# Patient Record
Sex: Female | Born: 1955 | Race: Black or African American | Hispanic: No | Smoking: Never smoker
Health system: Southern US, Community
[De-identification: ages and names within clinical notes are randomized; demographics above are authoritative.]

## PROBLEM LIST (undated history)

## (undated) DIAGNOSIS — C50919 Malignant neoplasm of unspecified site of unspecified female breast: Secondary | ICD-10-CM

## (undated) HISTORY — DX: Malignant neoplasm of unspecified site of unspecified female breast: C50.919

---
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
## 2004-08-14 ENCOUNTER — Ambulatory Visit (HOSPITAL_COMMUNITY): Admission: RE | Admit: 2004-08-14 | Discharge: 2004-08-14 | Payer: Self-pay | Admitting: Obstetrics

## 2004-09-11 ENCOUNTER — Encounter: Admission: RE | Admit: 2004-09-11 | Discharge: 2004-09-11 | Payer: Self-pay | Admitting: Family Medicine

## 2006-07-26 ENCOUNTER — Other Ambulatory Visit: Admission: RE | Admit: 2006-07-26 | Discharge: 2006-07-26 | Payer: Self-pay | Admitting: Family Medicine

## 2006-08-27 ENCOUNTER — Encounter: Admission: RE | Admit: 2006-08-27 | Discharge: 2006-08-27 | Payer: Self-pay | Admitting: Family Medicine

## 2006-09-02 DIAGNOSIS — C50919 Malignant neoplasm of unspecified site of unspecified female breast: Secondary | ICD-10-CM

## 2006-09-02 HISTORY — DX: Malignant neoplasm of unspecified site of unspecified female breast: C50.919

## 2006-09-06 ENCOUNTER — Encounter: Admission: RE | Admit: 2006-09-06 | Discharge: 2006-09-06 | Payer: Self-pay | Admitting: Family Medicine

## 2006-09-23 ENCOUNTER — Encounter: Admission: RE | Admit: 2006-09-23 | Discharge: 2006-09-23 | Payer: Self-pay | Admitting: Family Medicine

## 2006-09-23 ENCOUNTER — Encounter (INDEPENDENT_AMBULATORY_CARE_PROVIDER_SITE_OTHER): Payer: Self-pay | Admitting: Diagnostic Radiology

## 2006-10-04 ENCOUNTER — Ambulatory Visit: Admission: RE | Admit: 2006-10-04 | Discharge: 2006-12-06 | Payer: Self-pay | Admitting: Radiation Oncology

## 2006-10-14 ENCOUNTER — Encounter: Admission: RE | Admit: 2006-10-14 | Discharge: 2006-10-14 | Payer: Self-pay | Admitting: Gynecology

## 2006-12-03 HISTORY — PX: BREAST LUMPECTOMY: SHX2

## 2006-12-21 ENCOUNTER — Ambulatory Visit (HOSPITAL_BASED_OUTPATIENT_CLINIC_OR_DEPARTMENT_OTHER): Admission: RE | Admit: 2006-12-21 | Discharge: 2006-12-21 | Payer: Self-pay | Admitting: General Surgery

## 2006-12-21 ENCOUNTER — Encounter: Admission: RE | Admit: 2006-12-21 | Discharge: 2006-12-21 | Payer: Self-pay | Admitting: General Surgery

## 2006-12-21 ENCOUNTER — Encounter (INDEPENDENT_AMBULATORY_CARE_PROVIDER_SITE_OTHER): Payer: Self-pay | Admitting: General Surgery

## 2006-12-31 ENCOUNTER — Ambulatory Visit: Admission: RE | Admit: 2006-12-31 | Discharge: 2007-03-21 | Payer: Self-pay | Admitting: Radiation Oncology

## 2007-01-10 ENCOUNTER — Ambulatory Visit: Payer: Self-pay | Admitting: Oncology

## 2007-01-10 LAB — CBC WITH DIFFERENTIAL/PLATELET
BASO%: 0.7 % (ref 0.0–2.0)
Basophils Absolute: 0 10*3/uL (ref 0.0–0.1)
Eosinophils Absolute: 0.2 10*3/uL (ref 0.0–0.5)
HCT: 33.8 % — ABNORMAL LOW (ref 34.8–46.6)
HGB: 12 g/dL (ref 11.6–15.9)
MONO#: 0.5 10*3/uL (ref 0.1–0.9)
NEUT#: 2.8 10*3/uL (ref 1.5–6.5)
NEUT%: 51.6 % (ref 39.6–76.8)
WBC: 5.5 10*3/uL (ref 3.9–10.0)
lymph#: 1.9 10*3/uL (ref 0.9–3.3)

## 2007-01-10 LAB — COMPREHENSIVE METABOLIC PANEL
AST: 13 U/L (ref 0–37)
Albumin: 4.1 g/dL (ref 3.5–5.2)
BUN: 11 mg/dL (ref 6–23)
CO2: 24 mEq/L (ref 19–32)
Calcium: 9.1 mg/dL (ref 8.4–10.5)
Chloride: 106 mEq/L (ref 96–112)
Creatinine, Ser: 0.73 mg/dL (ref 0.40–1.20)
Glucose, Bld: 113 mg/dL — ABNORMAL HIGH (ref 70–99)
Potassium: 3.9 mEq/L (ref 3.5–5.3)

## 2007-03-09 ENCOUNTER — Ambulatory Visit: Payer: Self-pay | Admitting: Oncology

## 2007-06-08 ENCOUNTER — Ambulatory Visit: Payer: Self-pay | Admitting: Oncology

## 2007-06-22 LAB — COMPREHENSIVE METABOLIC PANEL
Alkaline Phosphatase: 50 U/L (ref 39–117)
CO2: 25 mEq/L (ref 19–32)
Creatinine, Ser: 0.72 mg/dL (ref 0.40–1.20)
Glucose, Bld: 79 mg/dL (ref 70–99)
Total Bilirubin: 0.5 mg/dL (ref 0.3–1.2)

## 2007-06-22 LAB — CBC WITH DIFFERENTIAL/PLATELET
Eosinophils Absolute: 0.2 10*3/uL (ref 0.0–0.5)
HCT: 35.4 % (ref 34.8–46.6)
LYMPH%: 26.4 % (ref 14.0–48.0)
MCHC: 35.6 g/dL (ref 32.0–36.0)
MCV: 89 fL (ref 81.0–101.0)
MONO#: 0.8 10*3/uL (ref 0.1–0.9)
MONO%: 13.4 % — ABNORMAL HIGH (ref 0.0–13.0)
NEUT#: 3.3 10*3/uL (ref 1.5–6.5)
NEUT%: 56.6 % (ref 39.6–76.8)
Platelets: 288 10*3/uL (ref 145–400)
WBC: 5.9 10*3/uL (ref 3.9–10.0)

## 2007-06-22 LAB — LACTATE DEHYDROGENASE: LDH: 166 U/L (ref 94–250)

## 2007-09-09 ENCOUNTER — Encounter: Admission: RE | Admit: 2007-09-09 | Discharge: 2007-09-09 | Payer: Self-pay | Admitting: Oncology

## 2007-09-20 ENCOUNTER — Ambulatory Visit: Payer: Self-pay | Admitting: Oncology

## 2008-09-18 ENCOUNTER — Ambulatory Visit: Payer: Self-pay | Admitting: Oncology

## 2008-09-20 LAB — COMPREHENSIVE METABOLIC PANEL
AST: 18 U/L (ref 0–37)
Alkaline Phosphatase: 50 U/L (ref 39–117)
BUN: 9 mg/dL (ref 6–23)
CO2: 29 mEq/L (ref 19–32)
Chloride: 108 mEq/L (ref 96–112)
Glucose, Bld: 95 mg/dL (ref 70–99)
Sodium: 141 mEq/L (ref 135–145)
Total Protein: 6.9 g/dL (ref 6.0–8.3)

## 2008-09-20 LAB — CBC WITH DIFFERENTIAL/PLATELET
Eosinophils Absolute: 0.1 10*3/uL (ref 0.0–0.5)
MCH: 30 pg (ref 25.1–34.0)
MCV: 82.8 fL (ref 79.5–101.0)
MONO#: 0.6 10*3/uL (ref 0.1–0.9)
MONO%: 8.7 % (ref 0.0–14.0)
NEUT%: 53 % (ref 38.4–76.8)
Platelets: 220 10*3/uL (ref 145–400)
WBC: 6.8 10*3/uL (ref 3.9–10.3)
lymph#: 2.4 10*3/uL (ref 0.9–3.3)

## 2008-10-23 ENCOUNTER — Encounter: Admission: RE | Admit: 2008-10-23 | Discharge: 2008-10-23 | Payer: Self-pay | Admitting: Oncology

## 2009-10-22 ENCOUNTER — Ambulatory Visit: Payer: Self-pay | Admitting: Oncology

## 2009-10-24 ENCOUNTER — Encounter: Admission: RE | Admit: 2009-10-24 | Discharge: 2009-10-24 | Payer: Self-pay | Admitting: Oncology

## 2009-10-24 LAB — CBC WITH DIFFERENTIAL/PLATELET
Eosinophils Absolute: 0.1 10*3/uL (ref 0.0–0.5)
HGB: 12.3 g/dL (ref 11.6–15.9)
LYMPH%: 26.9 % (ref 14.0–49.7)
MCH: 31 pg (ref 25.1–34.0)
MCHC: 34.4 g/dL (ref 31.5–36.0)
MCV: 90.2 fL (ref 79.5–101.0)
MONO#: 0.6 10*3/uL (ref 0.1–0.9)
MONO%: 9.6 % (ref 0.0–14.0)
RBC: 3.96 10*6/uL (ref 3.70–5.45)
RDW: 13.3 % (ref 11.2–14.5)
lymph#: 1.7 10*3/uL (ref 0.9–3.3)

## 2009-10-25 LAB — COMPREHENSIVE METABOLIC PANEL
ALT: 10 U/L (ref 0–35)
AST: 11 U/L (ref 0–37)
Albumin: 3.8 g/dL (ref 3.5–5.2)
CO2: 28 mEq/L (ref 19–32)
Calcium: 9 mg/dL (ref 8.4–10.5)
Potassium: 4 mEq/L (ref 3.5–5.3)
Total Bilirubin: 0.5 mg/dL (ref 0.3–1.2)
Total Protein: 7 g/dL (ref 6.0–8.3)

## 2009-10-25 LAB — CANCER ANTIGEN 27.29: CA 27.29: 14 U/mL (ref 0–39)

## 2009-10-29 ENCOUNTER — Encounter: Admission: RE | Admit: 2009-10-29 | Discharge: 2009-10-29 | Payer: Self-pay | Admitting: Oncology

## 2009-11-01 ENCOUNTER — Ambulatory Visit: Payer: Self-pay | Admitting: Genetic Counselor

## 2010-05-25 ENCOUNTER — Encounter: Payer: Self-pay | Admitting: Family Medicine

## 2010-07-04 ENCOUNTER — Encounter: Payer: Managed Care, Other (non HMO) | Admitting: Oncology

## 2010-07-07 ENCOUNTER — Other Ambulatory Visit (HOSPITAL_COMMUNITY)
Admission: RE | Admit: 2010-07-07 | Discharge: 2010-07-07 | Disposition: A | Discharge status: Home / Self Care | Payer: Managed Care, Other (non HMO) | Source: Ambulatory Visit | Attending: Family Medicine | Admitting: Family Medicine

## 2010-07-07 ENCOUNTER — Other Ambulatory Visit: Payer: Self-pay | Admitting: Family Medicine

## 2010-07-07 DIAGNOSIS — Z0000 Encounter for general adult medical examination without abnormal findings: Secondary | ICD-10-CM | POA: Insufficient documentation

## 2010-09-16 NOTE — Op Note (Signed)
Janet Evans, Janet Evans       ACCOUNT NO.:  [id number]   MEDICAL RECORD NO.:  [id number]          PATIENT TYPE:  AMB   LOCATION:  DSC                          FACILITY:  [facility]   PHYSICIAN:  Angelia Mould. Derrell Lolling, M.D.DATE OF BIRTH:  11-15-1955   DATE OF PROCEDURE:  12/21/2006  DATE OF DISCHARGE:                               OPERATIVE REPORT   PREOPERATIVE DIAGNOSIS:  Ductal carcinoma in situ left breast.   POSTOPERATIVE DIAGNOSIS:  Ductal carcinoma in situ left breast.   OPERATION PERFORMED:  Left partial mastectomy with needle localization  and specimen mammogram.   SURGEON:  Angelia Mould. Derrell Lolling, M.D.   OPERATIVE INDICATIONS:  This is a 55 year old black female who underwent  mammograms, ultrasound and MRI recently.  She was found to have a focal  area of clustered microcalcifications centrally within the left breast.  Stereotactic core biopsy was performed and revealed ductal carcinoma in  situ.  The tumor is hormone receptor positive.  The MRI showed that this  was a solitary finding.  She was interested in breast conservation.  We  counseled her as an outpatient.  She was brought to the operating room  electively.   OPERATIVE TECHNIQUE:  The patient underwent needle localization by Dr.  Cain Saupe this morning, and that was very satisfactory.  She was  brought to operating room.  General anesthesia was induced.  The left  breast was prepped and draped in a sterile fashion.  The patient was  identified as the correct patient, correct procedure and correct site.  Intravenous antibiotics were given prior to the incision.  The  radiographs were reviewed and oriented.   The localizing wire entered the left breast superiorly and was directed  inferiorly and posteriorly.  A curved incision was made superiorly in  the right breast, about halfway between the insertion site of the wire  and the areolar margin.  Dissection was carried down into the breast  tissue and around the  localizing wire.  I took the dissection more  medially at Dr. Teodoro Kil suggestion, because the calcifications were  central and the previously placed clip was a little bit medial.  We got  all the way around the tissue.  We marked the specimen with silk sutures  to mark the superficial and the lateral margins.  Specimen mammogram  revealed that I had completely removed the microcalcifications and the  clip.  The specimen was sent for routine histology.   I discussed this with Dr. Jimmy Picket in the pathology lab.  He did not  feel that imprint cytology would be useful in ductal carcinoma in situ.   Hemostasis was excellent and achieved with electrocautery.  The deeper  breast tissues were closed with interrupted sutures of 3-0 Vicryl.  The  skin was closed with a running subcuticular suture of  4-0 Monocryl and  Steri-Strips.  Clean bandages were placed and the patient taken to  recovery room in stable condition.  Estimated blood loss was about 10-15  mL.   COMPLICATIONS:  None.   COUNTS:  Sponge and needle and instrument counts were correct.      Angelia Mould. Derrell Lolling, M.D.  Electronically Signed     HMI/MEDQ  D:  12/21/2006  T:  12/21/2006  Job:  [id number]   cc:   Norva Pavlov, M.D.  Deatra James, M.D.  Billie Lade, M.D.

## 2010-09-18 ENCOUNTER — Other Ambulatory Visit: Payer: Self-pay | Admitting: Gastroenterology

## 2010-11-21 ENCOUNTER — Other Ambulatory Visit (HOSPITAL_COMMUNITY): Payer: Self-pay | Admitting: Family Medicine

## 2010-11-21 ENCOUNTER — Other Ambulatory Visit: Payer: Self-pay | Admitting: Oncology

## 2010-11-21 DIAGNOSIS — Z9889 Other specified postprocedural states: Secondary | ICD-10-CM

## 2010-11-21 DIAGNOSIS — Z853 Personal history of malignant neoplasm of breast: Secondary | ICD-10-CM

## 2010-11-27 ENCOUNTER — Ambulatory Visit
Admission: RE | Admit: 2010-11-27 | Discharge: 2010-11-27 | Disposition: A | Discharge status: Home / Self Care | Payer: Managed Care, Other (non HMO) | Source: Ambulatory Visit | Attending: Oncology | Admitting: Oncology

## 2010-11-27 DIAGNOSIS — Z853 Personal history of malignant neoplasm of breast: Secondary | ICD-10-CM

## 2010-11-27 DIAGNOSIS — Z9889 Other specified postprocedural states: Secondary | ICD-10-CM

## 2011-02-10 ENCOUNTER — Other Ambulatory Visit: Payer: Self-pay | Admitting: Oncology

## 2011-02-10 ENCOUNTER — Encounter (HOSPITAL_BASED_OUTPATIENT_CLINIC_OR_DEPARTMENT_OTHER): Payer: Managed Care, Other (non HMO) | Admitting: Oncology

## 2011-02-10 DIAGNOSIS — D0590 Unspecified type of carcinoma in situ of unspecified breast: Secondary | ICD-10-CM

## 2011-02-10 DIAGNOSIS — Z79810 Long term (current) use of selective estrogen receptor modulators (SERMs): Secondary | ICD-10-CM

## 2011-02-10 DIAGNOSIS — Z170 Estrogen receptor positive status [ER+]: Secondary | ICD-10-CM

## 2011-02-10 LAB — CBC WITH DIFFERENTIAL/PLATELET
Basophils Absolute: 0 10*3/uL (ref 0.0–0.1)
EOS%: 5 % (ref 0.0–7.0)
HCT: 34.4 % — ABNORMAL LOW (ref 34.8–46.6)
HGB: 12.3 g/dL (ref 11.6–15.9)
MCH: 30 pg (ref 25.1–34.0)
MONO#: 0.8 10*3/uL (ref 0.1–0.9)
NEUT#: 3.3 10*3/uL (ref 1.5–6.5)
NEUT%: 54.3 % (ref 38.4–76.8)
RDW: 13.5 % (ref 11.2–14.5)
WBC: 6.2 10*3/uL (ref 3.9–10.3)
lymph#: 1.6 10*3/uL (ref 0.9–3.3)

## 2011-02-10 LAB — COMPREHENSIVE METABOLIC PANEL
AST: 14 U/L (ref 0–37)
Albumin: 3.7 g/dL (ref 3.5–5.2)
Alkaline Phosphatase: 59 U/L (ref 39–117)
BUN: 15 mg/dL (ref 6–23)
Calcium: 8.8 mg/dL (ref 8.4–10.5)
Chloride: 108 mEq/L (ref 96–112)
Glucose, Bld: 92 mg/dL (ref 70–99)
Potassium: 4 mEq/L (ref 3.5–5.3)
Sodium: 141 mEq/L (ref 135–145)
Total Protein: 6.8 g/dL (ref 6.0–8.3)

## 2011-02-13 LAB — CBC
HCT: 33.2 — ABNORMAL LOW
Hemoglobin: 11.4 — ABNORMAL LOW
MCV: 88.1
Platelets: 292
RDW: 17.1 — ABNORMAL HIGH

## 2011-02-13 LAB — COMPREHENSIVE METABOLIC PANEL
ALT: 12
AST: 12
Alkaline Phosphatase: 42
BUN: 12
Calcium: 8.6
Chloride: 108
GFR calc non Af Amer: >60
Glucose, Bld: 110 — ABNORMAL HIGH
Total Protein: 6.6

## 2011-02-13 LAB — URINALYSIS, ROUTINE W REFLEX MICROSCOPIC
Glucose, UA: NEGATIVE
Hgb urine dipstick: NEGATIVE
Ketones, ur: NEGATIVE
Protein, ur: NEGATIVE
Urobilinogen, UA: 1

## 2011-02-13 LAB — DIFFERENTIAL
Basophils Relative: 1
Eosinophils Absolute: 0.1
Eosinophils Relative: 3
Lymphs Abs: 1.5
Monocytes Relative: 11

## 2011-02-13 LAB — URINE MICROSCOPIC-ADD ON

## 2011-02-18 ENCOUNTER — Encounter (HOSPITAL_BASED_OUTPATIENT_CLINIC_OR_DEPARTMENT_OTHER): Payer: Managed Care, Other (non HMO) | Admitting: Oncology

## 2011-02-18 DIAGNOSIS — D0590 Unspecified type of carcinoma in situ of unspecified breast: Secondary | ICD-10-CM

## 2011-02-18 DIAGNOSIS — Z923 Personal history of irradiation: Secondary | ICD-10-CM

## 2011-02-18 DIAGNOSIS — Z79810 Long term (current) use of selective estrogen receptor modulators (SERMs): Secondary | ICD-10-CM

## 2011-02-18 DIAGNOSIS — Z170 Estrogen receptor positive status [ER+]: Secondary | ICD-10-CM

## 2011-12-18 ENCOUNTER — Telehealth: Payer: Self-pay | Admitting: Oncology

## 2011-12-18 NOTE — Telephone Encounter (Signed)
lmonvm for pt re appt for 10/18 and mailed schedule,

## 2012-02-11 ENCOUNTER — Other Ambulatory Visit: Payer: Self-pay | Admitting: *Deleted

## 2012-02-11 DIAGNOSIS — C50919 Malignant neoplasm of unspecified site of unspecified female breast: Secondary | ICD-10-CM

## 2012-02-12 ENCOUNTER — Other Ambulatory Visit: Payer: Managed Care, Other (non HMO) | Admitting: Lab

## 2012-02-12 ENCOUNTER — Ambulatory Visit: Payer: Managed Care, Other (non HMO) | Admitting: Oncology

## 2012-02-19 ENCOUNTER — Other Ambulatory Visit: Payer: Managed Care, Other (non HMO) | Admitting: Lab

## 2012-02-19 ENCOUNTER — Telehealth: Payer: Self-pay | Admitting: *Deleted

## 2012-02-19 ENCOUNTER — Ambulatory Visit: Payer: Managed Care, Other (non HMO) | Admitting: Oncology

## 2012-02-19 NOTE — Telephone Encounter (Signed)
Gave patient appointment for 03-08-2012 starting at 1:00pm

## 2012-03-08 ENCOUNTER — Ambulatory Visit (HOSPITAL_BASED_OUTPATIENT_CLINIC_OR_DEPARTMENT_OTHER): Payer: Managed Care, Other (non HMO) | Admitting: Oncology

## 2012-03-08 ENCOUNTER — Telehealth: Payer: Self-pay | Admitting: Oncology

## 2012-03-08 ENCOUNTER — Other Ambulatory Visit (HOSPITAL_BASED_OUTPATIENT_CLINIC_OR_DEPARTMENT_OTHER): Payer: Managed Care, Other (non HMO)

## 2012-03-08 VITALS — BP 161/85 | HR 87 | Temp 98.3°F | Resp 20 | Ht 63.0 in | Wt 140.0 lb

## 2012-03-08 DIAGNOSIS — D0590 Unspecified type of carcinoma in situ of unspecified breast: Secondary | ICD-10-CM

## 2012-03-08 DIAGNOSIS — D0510 Intraductal carcinoma in situ of unspecified breast: Secondary | ICD-10-CM

## 2012-03-08 DIAGNOSIS — C50919 Malignant neoplasm of unspecified site of unspecified female breast: Secondary | ICD-10-CM

## 2012-03-08 LAB — CBC WITH DIFFERENTIAL/PLATELET
BASO%: 1.1 % (ref 0.0–2.0)
EOS%: 1.4 % (ref 0.0–7.0)
LYMPH%: 31.3 % (ref 14.0–49.7)
MCHC: 35.1 g/dL (ref 31.5–36.0)
MCV: 88.1 fL (ref 79.5–101.0)
MONO%: 8 % (ref 0.0–14.0)
Platelets: 239 10*3/uL (ref 145–400)
RBC: 4.12 10*6/uL (ref 3.70–5.45)

## 2012-03-08 LAB — COMPREHENSIVE METABOLIC PANEL (CC13)
ALT: 11 U/L (ref 0–55)
AST: 12 U/L (ref 5–34)
Alkaline Phosphatase: 68 U/L (ref 40–150)
Sodium: 141 mEq/L (ref 136–145)
Total Bilirubin: 0.56 mg/dL (ref 0.20–1.20)
Total Protein: 6.9 g/dL (ref 6.4–8.3)

## 2012-03-08 NOTE — Telephone Encounter (Signed)
gve the pt her nov 2014 appt calendar along with the mammo appt for nov at [facility]

## 2012-03-08 NOTE — Progress Notes (Signed)
Hematology and Oncology Follow Up Visit  Janet Evans [id number] 1956/03/23 56 y.o. 03/08/2012 2:25 PM   DIAGNOSIS:   Encounter Diagnosis  Name Primary?  Marland Kitchen DCIS (ductal carcinoma in situ) Yes     PAST THERAPY:  Lumpectomy 12/21/2006, although radiation therapy currently completing 5 years of tamoxifen therapy Interim History:  Patient returns for followup. She is doing well. She still is on tamoxifen and of this year. She has not had a mammogram that we can find for over a year. She is otherwise doing well. She is amenorrheic. She does admit any bleeding she does have occasional hot flashes.  Medications: I have reviewed the patient's current medications.  Allergies: Allergies not on file  Past Medical History, Surgical history, Social history, and Family History were reviewed and updated.  Review of Systems: Constitutional:  Negative for fever, chills, night sweats, anorexia, weight loss, pain. Cardiovascular: no chest pain or dyspnea on exertion Respiratory: no cough, shortness of breath, or wheezing Neurological: no TIA or stroke symptoms Dermatological: negative ENT: negative Skin Gastrointestinal: negative Genito-Urinary: negative Hematological and Lymphatic: negative Breast: negative Musculoskeletal: negative Remaining ROS negative.  Physical Exam:  Blood pressure 161/85, pulse 87, temperature 98.3 F (36.8 C), resp. rate 20, height 5\' 3"  (1.6 m), weight 140 lb (63.504 kg).  ECOG:  0  HEENT:  Sclerae anicteric, conjunctivae pink.  Oropharynx clear.  No mucositis or candidiasis.  Nodes:  No cervical, supraclavicular, or axillary lymphadenopathy palpated.  Breast Exam:  Right breast is benign.  No masses, discharge, skin change, or nipple inversion.  Left breast is benign.  No masses, discharge, skin change, or nipple inversion..  Lungs:  Clear to auscultation bilaterally.  No crackles, rhonchi, or wheezes.  Heart:  Regular rate and rhythm.  Abdomen:  Soft,  nontender.  Positive bowel sounds.  No organomegaly or masses palpated.  Musculoskeletal:  No focal spinal tenderness to palpation.  Extremities:  Benign.  No peripheral edema or cyanosis.  Skin:  Benign.  Neuro:  Nonfocal.    Lab Results: Lab Results  Component Value Date   WBC 8.1 03/08/2012   HGB 12.7 03/08/2012   HCT 36.3 03/08/2012   MCV 88.1 03/08/2012   PLT 239 03/08/2012     Chemistry      Component Value Date/Time   NA 141 03/08/2012 1303   NA 141 02/10/2011 0853   K 3.6 03/08/2012 1303   K 4.0 02/10/2011 0853   CL 107 03/08/2012 1303   CL 108 02/10/2011 0853   CO2 27 03/08/2012 1303   CO2 26 02/10/2011 0853   BUN 10.0 03/08/2012 1303   BUN 15 02/10/2011 0853   CREATININE 0.8 03/08/2012 1303   CREATININE 0.77 02/10/2011 0853      Component Value Date/Time   CALCIUM 9.3 03/08/2012 1303   CALCIUM 8.8 02/10/2011 0853   ALKPHOS 68 03/08/2012 1303   ALKPHOS 59 02/10/2011 0853   AST 12 03/08/2012 1303   AST 14 02/10/2011 0853   ALT 11 03/08/2012 1303   ALT 11 02/10/2011 0853   BILITOT 0.56 03/08/2012 1303   BILITOT 0.3 02/10/2011 0853       Radiological Studies:  No results found.   IMPRESSIONS AND PLAN: A 56 y.o. female with   History of ER/PR positive DCIS status post lumpectomy, radiation and completing 5 years of tamoxifen. I will see her in a years time for followup. I arrange for a mammogram this month as well as a year from now.  Spent more than  half the time coordinating care, as well as discussion of BMI and its implications.      Janet Evans 11/5/20132:25 PM Cell [phone]

## 2012-03-17 ENCOUNTER — Ambulatory Visit
Admission: RE | Admit: 2012-03-17 | Discharge: 2012-03-17 | Disposition: A | Discharge status: Home / Self Care | Payer: Managed Care, Other (non HMO) | Source: Ambulatory Visit | Attending: Oncology | Admitting: Oncology

## 2012-03-17 DIAGNOSIS — D0510 Intraductal carcinoma in situ of unspecified breast: Secondary | ICD-10-CM

## 2012-07-23 ENCOUNTER — Telehealth: Payer: Self-pay | Admitting: Oncology

## 2012-07-23 NOTE — Telephone Encounter (Signed)
UNABLE TO CONTACT THE PT VIA TELEPHONE DUE TO THE MESSAGE STATES DUE TO THE SUBSCRIBERS REQUEST THEY DO NOT WISH TO BE CALLED AT THAT NUMBER. NO OTHER NUMBERS LISTED. WILL MAIL THE PT AN APPT CALENDAR REGARDING THE APPT CHANGES ALONG WITH A F/U LETTER

## 2012-07-30 ENCOUNTER — Encounter: Payer: Self-pay | Admitting: Oncology

## 2012-11-03 ENCOUNTER — Telehealth: Payer: Self-pay | Admitting: *Deleted

## 2012-11-03 NOTE — Telephone Encounter (Signed)
Left vm for pt to return call to r/s f/u appt with Jackie Hunter, NP 

## 2012-11-28 ENCOUNTER — Telehealth: Payer: Self-pay | Admitting: *Deleted

## 2012-11-28 NOTE — Telephone Encounter (Signed)
Left vm for pt to return call to get pt established in Dr. Darrall Dears practice.  Will send a letter requesting pt to call office to come in at earlier time.

## 2012-11-28 NOTE — Telephone Encounter (Signed)
Confirmed new appt date and time with Norina Buzzard, NP on 12/06/12 at 3:00pm.  Gave pt directions and instructions.  Pt denies further needs at this time.  Contact information given.

## 2012-12-06 ENCOUNTER — Encounter: Payer: Self-pay | Admitting: Family

## 2012-12-06 ENCOUNTER — Ambulatory Visit (HOSPITAL_BASED_OUTPATIENT_CLINIC_OR_DEPARTMENT_OTHER): Payer: Managed Care, Other (non HMO) | Admitting: Family

## 2012-12-06 ENCOUNTER — Telehealth: Payer: Self-pay | Admitting: Oncology

## 2012-12-06 VITALS — BP 138/93 | HR 92 | Temp 99.0°F | Resp 20 | Ht 63.0 in | Wt 132.4 lb

## 2012-12-06 DIAGNOSIS — Z853 Personal history of malignant neoplasm of breast: Secondary | ICD-10-CM

## 2012-12-06 DIAGNOSIS — D0510 Intraductal carcinoma in situ of unspecified breast: Secondary | ICD-10-CM | POA: Insufficient documentation

## 2012-12-06 DIAGNOSIS — D0512 Intraductal carcinoma in situ of left breast: Secondary | ICD-10-CM

## 2012-12-06 NOTE — Progress Notes (Addendum)
[facility]  [phone] [phone]  OFFICE PROGRESS NOTE   ID: Janet Evans   DOB: May 17, 1955  MR#: [id number]  ZHY#:[id number]   PCP: Leanor Rubenstein, MD SU: Claud Kelp, M.D. RAD ONC: Antony Blackbird, M.D.   HISTORY OF PRESENT ILLNESS: From Dr. Theron Arista Rubin's new patient evaluation note dated 01/10/2007: "This is a pleasant 57 year old woman referred by Dr. Derrell Lolling for evaluation and treatment of DCIS. This woman has been in good health.  She undergoes intermittent mammograms.  I believe the last one was in April 2006.  She underwent routine screening mammogram on 09/06/06, which shows indeterminate cluster of calcifications within the left breast.  Biopsy was recommended.  Biopsy on 09/23/06 showed DCIS with microcalcifications, intermediate grade, ER/PR positive 85% and 99% respectively.  Subsequent staging MRI scan 10/14/06 showed no suspicious masses in either breast.  No axillary adenopathy.  Ultimately the patient underwent a lumpectomy on 12/21/06, which ultimately showed DCIS expanding an area of 0.3 cm with calcifications.  Surgical margins were clear.  Patient has had an unremarkable postoperative course.  She has seen radiation oncology and is here today to discuss possible hormonal therapy."  Her subsequent history is as detailed below  INTERVAL HISTORY: Dr. Darnelle Catalan and I saw Janet Evans today for followup of left breast DCIS.  The patient was last seen by Dr. Donnie Coffin on 03/08/2012.  Since her last office visit, the patient has been doing relatively well.  She is establishing herself with Dr. Darrall Dears service today.  REVIEW OF SYSTEMS: A 10 point review of systems was completed and is negative except mild hot flashes.  The patient states since she stopped taking antiestrogen therapy with Tamoxifen in 03/2012 she has been able to lose weight.  The patient denies any other symptomatology.  A detailed review of systems is otherwise  noncontributory.   PAST MEDICAL HISTORY: Past Medical History  Diagnosis Date  . Breast cancer 09/2006    Left breast DCIS    PAST SURGICAL HISTORY: Past Surgical History  Procedure Laterality Date  . Mole removal  1976  . Breast lumpectomy Left 12/2006    FAMILY HISTORY Family History  Problem Relation Age of Onset  . Stroke Brother   . Cancer Brother     Prostate cancer  . Cancer Brother     Colon Cancer  . Hypertension Mother   . Diabetes Mother   . Cancer Mother 75    Breast cancer  . Dementia Mother   . HIV/AIDS Brother   . Cancer Maternal Aunt     Breast cancer  . Cancer Paternal Uncle     2 Uncles with prostate cancer  Maternal aunt with breast cancer, paternal uncle with history of prostate cancer, brother with history of prostate cancer.  She has a total of four brothers.  No sisters.  No history of breast or ovarian cancer in the family.   GYNECOLOGIC HISTORY: Gravida 5, para 2, (2 abortions, one stillbirth),  menarche age 35, age of parity 75,  menopause since 07/2007 at the age of 43, 2 years of birth control use that she states was inconsistent.   SOCIAL HISTORY: Janet Evans has been divorced since 2002.  She was previously married for 12 years.  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  She has 2 sons ages 36 and 37 respectively.  In her spare time she enjoys reading, playing board games, watching sporting events, volunteering, and attending [employer] (nondenominational).  ADVANCED DIRECTIVES: Not on file  HEALTH MAINTENANCE: History  Substance Use Topics  . Smoking status: Never Smoker   . Smokeless tobacco: Never Used  . Alcohol Use: Yes     Comment: Social     Colonoscopy: Not on file PAP: 07/07/2010 Bone density: The patient's last bone density scan on 10/29/2009 showed a T score of -0.9 (normal). Lipid panel: Not on file  Allergies  Allergen Reactions  . Penicillins Anaphylaxis    No current  outpatient prescriptions on file.   No current facility-administered medications for this visit.    OBJECTIVE: Filed Vitals:   12/06/12 1452  BP: 138/93  Pulse: 92  Temp: 99 F (37.2 C)  Resp: 20     Body mass index is 23.46 kg/(m^2).      ECOG FS: 1 - Symptomatic but completely ambulatory  General appearance: Alert, cooperative, well nourished, no apparent distress Head: Normocephalic, without obvious abnormality, atraumatic Eyes: Conjunctivae/corneas clear, PERRLA, EOMI Nose: Nares, septum and mucosa are normal, no drainage or sinus tenderness Neck: No adenopathy, supple, symmetrical, trachea midline, thyroid not enlarged, no tenderness Resp: Clear to auscultation bilaterally Cardio: Regular rate and rhythm, S1, S2 normal, no murmur, click, rub or gallop Breasts: Pendulous bilaterally, left breast has well-healed surgical scar, no nipple inversion, bilateral axillary fullness, breast has glandular tissue bilaterally (left greater than right), firm medial and inframammary ridges, right breast is visibly bigger than left breast GI: Soft, not distended, non-tender, normoactive bowel sounds, no organomegaly Extremities: Extremities normal, atraumatic, no cyanosis or edema Lymph nodes: Cervical, supraclavicular, and axillary nodes normal Neurologic: Grossly normal   LAB RESULTS: Lab Results  Component Value Date   WBC 8.1 03/08/2012   NEUTROABS 4.7 03/08/2012   HGB 12.7 03/08/2012   HCT 36.3 03/08/2012   MCV 88.1 03/08/2012   PLT 239 03/08/2012      Chemistry      Component Value Date/Time   NA 141 03/08/2012 1303   NA 141 02/10/2011 0853   K 3.6 03/08/2012 1303   K 4.0 02/10/2011 0853   CL 107 03/08/2012 1303   CL 108 02/10/2011 0853   CO2 27 03/08/2012 1303   CO2 26 02/10/2011 0853   BUN 10.0 03/08/2012 1303   BUN 15 02/10/2011 0853   CREATININE 0.8 03/08/2012 1303   CREATININE 0.77 02/10/2011 0853      Component Value Date/Time   CALCIUM 9.3 03/08/2012 1303   CALCIUM 8.8  02/10/2011 0853   ALKPHOS 68 03/08/2012 1303   ALKPHOS 59 02/10/2011 0853   AST 12 03/08/2012 1303   AST 14 02/10/2011 0853   ALT 11 03/08/2012 1303   ALT 11 02/10/2011 0853   BILITOT 0.56 03/08/2012 1303   BILITOT 0.3 02/10/2011 0853      Lab Results  Component Value Date   LABCA2 9 03/08/2012    Urinalysis    Component Value Date/Time   COLORURINE YELLOW 12/20/2006 1124   APPEARANCEUR CLOUDY* 12/20/2006 1124   LABSPEC 1.023 12/20/2006 1124   PHURINE 6.5 12/20/2006 1124   GLUCOSEU NEGATIVE 12/20/2006 1124   HGBUR NEGATIVE 12/20/2006 1124   BILIRUBINUR NEGATIVE 12/20/2006 1124   KETONESUR NEGATIVE 12/20/2006 1124   PROTEINUR NEGATIVE 12/20/2006 1124   UROBILINOGEN 1.0 12/20/2006 1124   NITRITE NEGATIVE 12/20/2006 1124   LEUKOCYTESUR MODERATE* 12/20/2006 1124    STUDIES: 1.  The patient's last bilateral digital diagnostic mammogram on 03/17/2012 showed there are scattered fibroglandular densities.  Left lumpectomy changes are present.  There is no suspicious  dominant mass, nonsurgical architectural distortion or calcification to suggest malignancy.  No mammographic evidence of malignancy.  Benign findings.  2.  The patient's last bone density scan on 10/29/2009 showed a T score of -0.9 (normal).    ASSESSMENT: Janet Evans is a 57 y.o. BRCA negative [place], [place] woman: 1.  Status post left breast central needle core biopsy on 09/23/2006 which showed ductal carcinoma in situ associated with microcalcifications, estrogen receptor 85% positive, progesterone receptor 99% positive.  2.  The patient had bilateral breast MRI on 10/14/2006 which showed no suspicious masses or enhancement are seen in either breast.  There is no axillary adenopathy bilaterally. There is no correlating MRI abnormality in the region of recently biopsied DCIS in the central left breast (clinical stage 0, pTis).  3.  Status post left breast lumpectomy on 12/21/2006 for a stage 0, pTis PNX, 0.3 cm ductal  carcinoma in situ low grade with microcalcifications and benign lobules with calcifications, estrogen receptor 85% positive, progesterone receptor 99% positive.  4.  The patient had radiation therapy from 01/27/2007 through 03/15/2007.  5.  The patient took antiestrogen therapy with Tamoxifen from 04/2007 through 03/2012.  6.  The patient had genetic counseling and testing and comprehensive BRACAnalysis report dated 07/14/2010 showed no mutation detected in BRCA1 and BRCA2 genes.  7.  Mild hot flashes  PLAN: The patient is over 6 years from her time of diagnosis and will officially become a graduate of [facility]'s breast cancer program today. We asked that she continue annual clinical breast examinations by a physician in addition to annual mammography.  Her last mammogram results are listed above.  We will schedule her next bilateral digital diagnostic mammogram due in 03/2013 for her.  The patient states her hot flashes are tolerable  All questions were answered.  The patient was encouraged to contact us with any problems, questions or concerns.   Larina Bras, NP-C 12/06/2012, 7:44 PM  ADDENDUM: I met with Janet Evans and reviewed her diagnosis, treatment history, and proptosis. In brief:  This 57 year old [place] woman underwent left lumpectomy without sentinel lymph node sampling August of 2008 for noninvasive ductal carcinoma (ductal carcinoma in situ), grade 1, measuring 3 mm. The tumor was estrogen receptor positive at 85%, progesterone receptor positive at 99%. She completed adjuvant radiation November of 2008. Antiestrogen therapy consisted of tamoxifen between December of 2008 in November of 2013.  The patient had an excellent prognosis with local treatment only, and she improved bit further by taking the anti-estrogens. She also cut in half her risk of developing a new breast cancer in either breast by taking tamoxifen for 5 years.  At this point no further treatment is  planned, I am comfortable releasing the patient to her primary care physician. Janet Evans has a good understanding of the overall plan and is very much in agreement. At this point, accordingly, no further routine appointments are being made for her here.  I personally saw this patient and performed a substantive portion of this encounter with the listed APP documented above.   Lowella Dell, MD

## 2012-12-06 NOTE — Patient Instructions (Signed)
Please contact us at [phone] if you have any questions or concerns.  Please continue to do well and enjoy life!!!  Get plenty of rest, drink plenty of water, exercise daily (walking), eat a balanced diet.  Consider taking vitamin D3 1000 IUs daily.   Complete monthly self-breast examinations.  Have a clinical breast exam by a physician every year.  Have your mammogram completed every year.

## 2013-03-09 ENCOUNTER — Other Ambulatory Visit: Payer: Managed Care, Other (non HMO) | Admitting: Lab

## 2013-03-09 ENCOUNTER — Ambulatory Visit: Payer: Managed Care, Other (non HMO) | Admitting: Family

## 2013-03-10 ENCOUNTER — Other Ambulatory Visit: Payer: Managed Care, Other (non HMO) | Admitting: Lab

## 2013-03-10 ENCOUNTER — Ambulatory Visit: Payer: Managed Care, Other (non HMO) | Admitting: Oncology

## 2014-02-26 ENCOUNTER — Ambulatory Visit
Admission: RE | Admit: 2014-02-26 | Discharge: 2014-02-26 | Disposition: A | Discharge status: Home / Self Care | Payer: Managed Care, Other (non HMO) | Source: Ambulatory Visit | Attending: Family Medicine | Admitting: Family Medicine

## 2014-02-26 ENCOUNTER — Other Ambulatory Visit: Payer: Self-pay | Admitting: Physician Assistant

## 2014-02-26 DIAGNOSIS — M25531 Pain in right wrist: Secondary | ICD-10-CM

## 2014-02-26 DIAGNOSIS — W19XXXA Unspecified fall, initial encounter: Secondary | ICD-10-CM

## 2014-02-26 DIAGNOSIS — M25561 Pain in right knee: Secondary | ICD-10-CM

## 2014-11-13 ENCOUNTER — Encounter: Payer: Self-pay | Admitting: Genetic Counselor

## 2015-03-19 ENCOUNTER — Other Ambulatory Visit: Payer: Self-pay

## 2015-03-19 DIAGNOSIS — Z1231 Encounter for screening mammogram for malignant neoplasm of breast: Secondary | ICD-10-CM

## 2015-04-11 ENCOUNTER — Ambulatory Visit: Payer: Managed Care, Other (non HMO)

## 2015-04-25 ENCOUNTER — Ambulatory Visit
Admission: RE | Admit: 2015-04-25 | Discharge: 2015-04-25 | Disposition: A | Discharge status: Home / Self Care | Payer: BLUE CROSS/BLUE SHIELD | Source: Ambulatory Visit

## 2015-04-25 DIAGNOSIS — Z1231 Encounter for screening mammogram for malignant neoplasm of breast: Secondary | ICD-10-CM

## 2015-04-26 ENCOUNTER — Other Ambulatory Visit: Payer: Self-pay | Admitting: Family Medicine

## 2015-04-26 DIAGNOSIS — R928 Other abnormal and inconclusive findings on diagnostic imaging of breast: Secondary | ICD-10-CM

## 2015-05-02 ENCOUNTER — Other Ambulatory Visit: Payer: Self-pay | Admitting: Gastroenterology

## 2015-05-03 ENCOUNTER — Ambulatory Visit
Admission: RE | Admit: 2015-05-03 | Discharge: 2015-05-03 | Disposition: A | Discharge status: Home / Self Care | Payer: BLUE CROSS/BLUE SHIELD | Source: Ambulatory Visit | Attending: Family Medicine | Admitting: Family Medicine

## 2015-05-03 DIAGNOSIS — R928 Other abnormal and inconclusive findings on diagnostic imaging of breast: Secondary | ICD-10-CM

## 2015-09-02 IMAGING — CR DG KNEE 1-2V*L*
2 series · 2 of 2 positions shown · non-contrast
Comparison: None.

CLINICAL DATA: Bilateral knee pain, fall on escalator 2 days ago

EXAM:
LEFT KNEE - 1-2 VIEW

[view not recorded (1 of 2)]
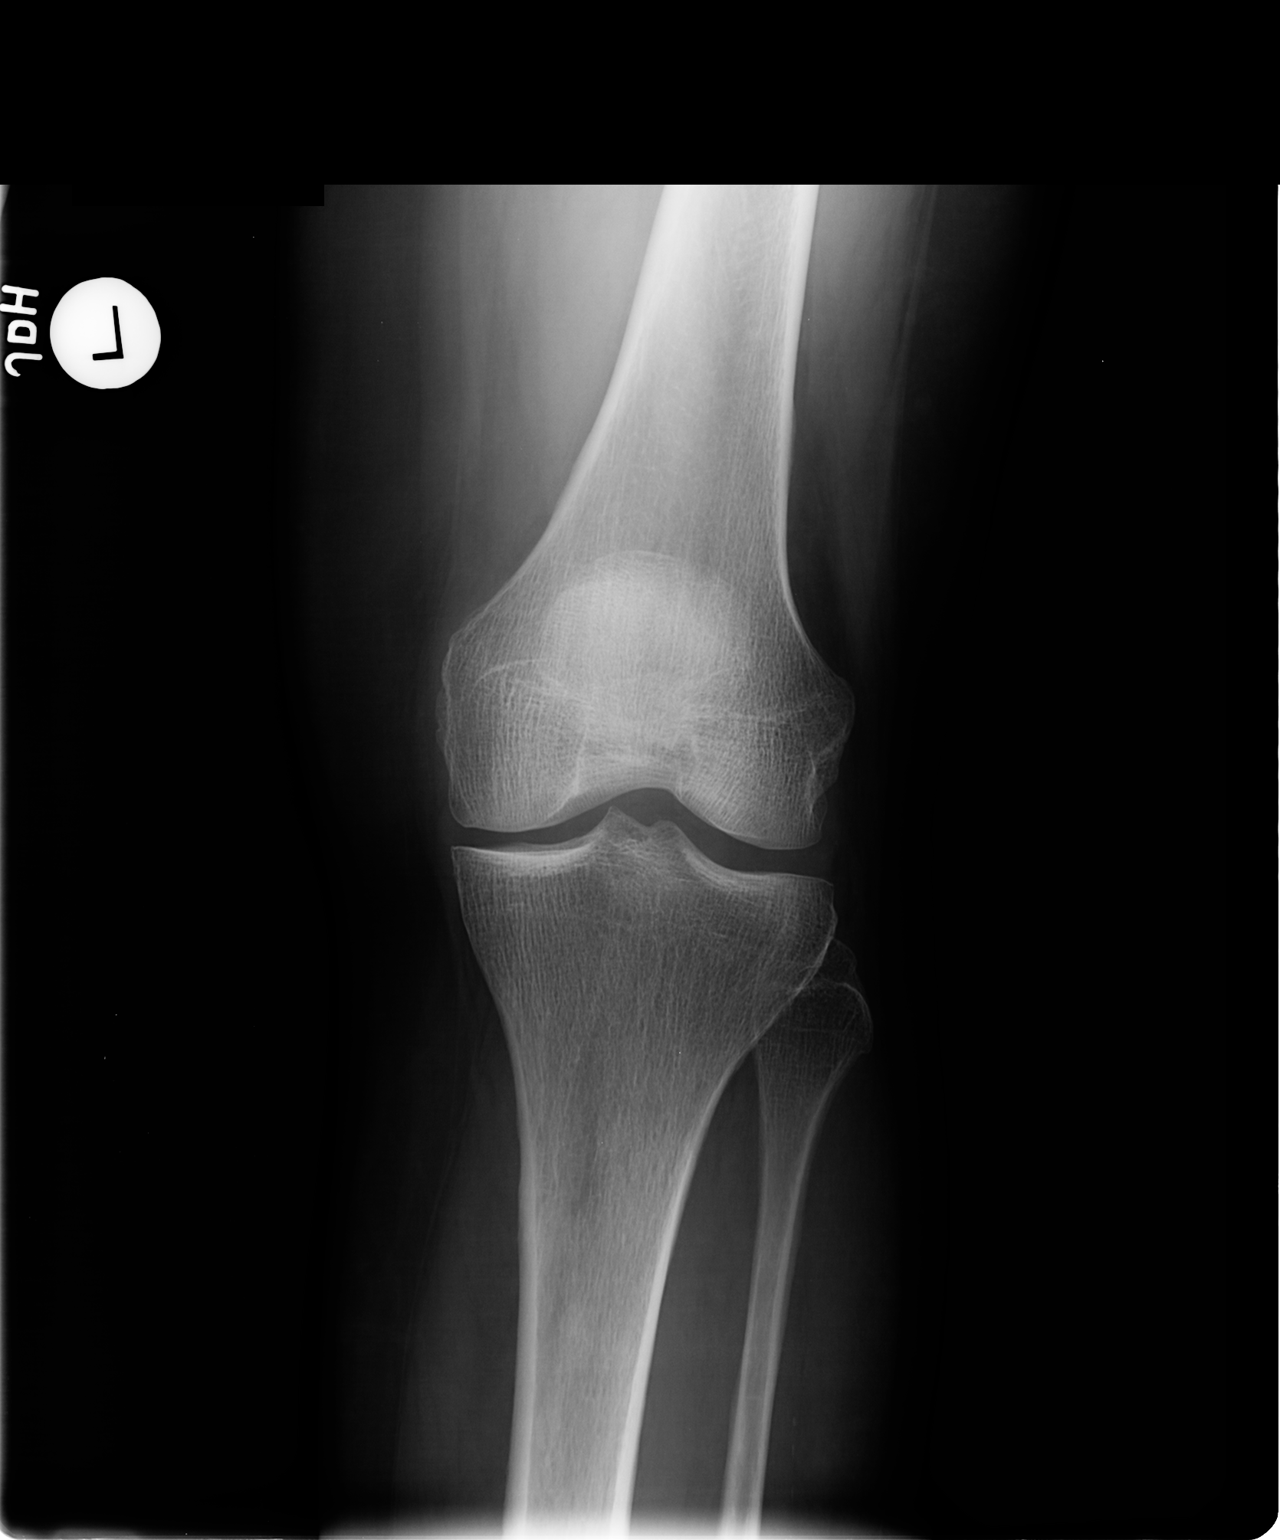

[view not recorded (2 of 2)]
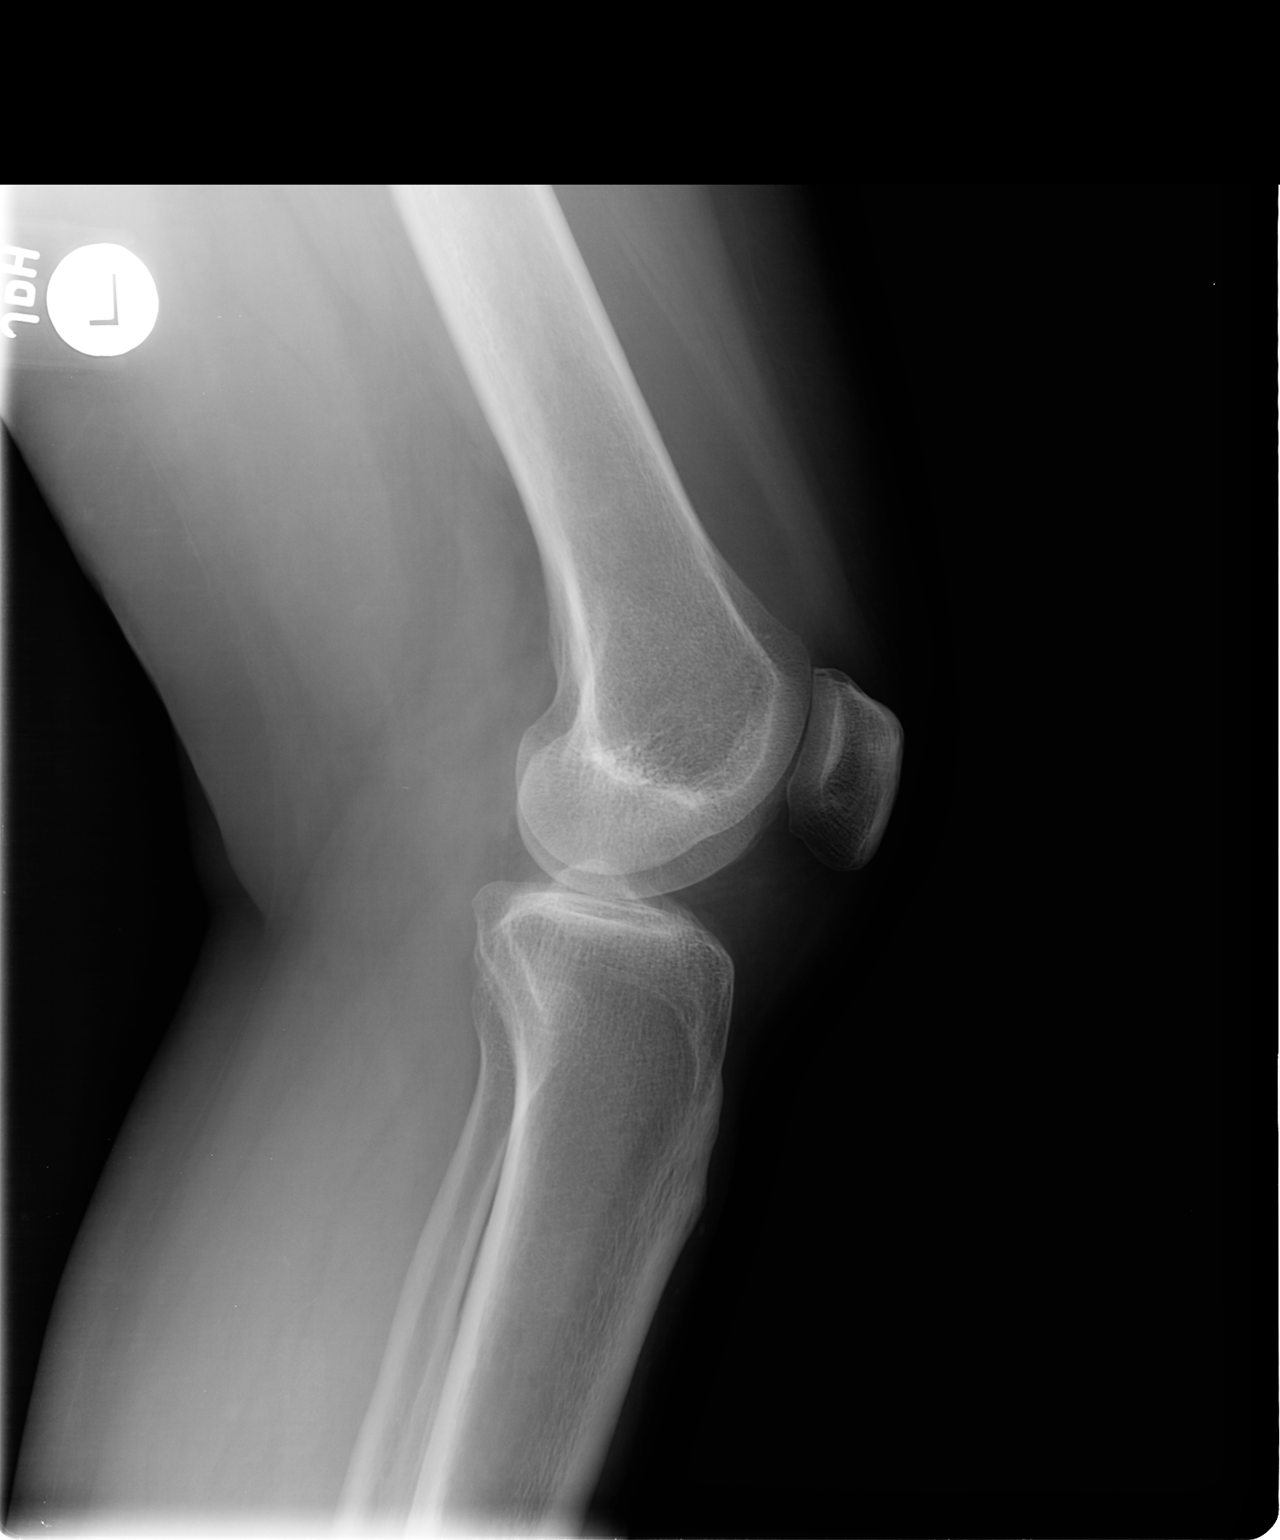

[2 of 2 positions shown; findings below may reference images not displayed]

FINDINGS: Two views of the left knee submitted. No acute fracture or
subluxation. Mild narrowing of medial joint compartment. No joint
effusion.
IMPRESSION: No acute fracture or subluxation. No joint effusion. Mild narrowing
of medial joint compartment.

## 2017-02-16 ENCOUNTER — Encounter: Payer: Self-pay | Admitting: Genetics

## 2019-12-15 ENCOUNTER — Encounter: Payer: Self-pay | Admitting: Genetic Counselor

## 2022-09-19 ENCOUNTER — Emergency Department (HOSPITAL_COMMUNITY): Payer: BC Managed Care – PPO

## 2022-09-19 ENCOUNTER — Emergency Department (HOSPITAL_COMMUNITY)
Admission: EM | Admit: 2022-09-19 | Discharge: 2022-09-19 | Disposition: A | Discharge status: Home / Self Care | Payer: BC Managed Care – PPO | Attending: Emergency Medicine | Admitting: Emergency Medicine

## 2022-09-19 ENCOUNTER — Other Ambulatory Visit: Payer: Self-pay

## 2022-09-19 ENCOUNTER — Encounter (HOSPITAL_COMMUNITY): Payer: Self-pay

## 2022-09-19 DIAGNOSIS — Y92410 Unspecified street and highway as the place of occurrence of the external cause: Secondary | ICD-10-CM | POA: Diagnosis not present

## 2022-09-19 DIAGNOSIS — R519 Headache, unspecified: Secondary | ICD-10-CM | POA: Insufficient documentation

## 2022-09-19 DIAGNOSIS — S5012XA Contusion of left forearm, initial encounter: Secondary | ICD-10-CM | POA: Diagnosis not present

## 2022-09-19 DIAGNOSIS — S59912A Unspecified injury of left forearm, initial encounter: Secondary | ICD-10-CM | POA: Diagnosis present

## 2022-09-19 DIAGNOSIS — M79602 Pain in left arm: Secondary | ICD-10-CM

## 2022-09-19 MED ORDER — ACETAMINOPHEN 500 MG PO TABS
1000.0000 mg | ORAL_TABLET | Freq: Once | ORAL | Status: AC
  Administered 2022-09-19: 1000 mg via ORAL
  Filled 2022-09-19: qty 2
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
